# Patient Record
Sex: Female | Born: 2000 | Race: White | Hispanic: No | Marital: Single | Smoking: Never smoker
Health system: Southern US, Community
[De-identification: ages and names within clinical notes are randomized; demographics above are authoritative.]

## PROBLEM LIST (undated history)

## (undated) ENCOUNTER — Ambulatory Visit (HOSPITAL_COMMUNITY): Admission: EM | Discharge status: Absent without leave | Payer: PRIVATE HEALTH INSURANCE

## (undated) DIAGNOSIS — D229 Melanocytic nevi, unspecified: Secondary | ICD-10-CM

## (undated) DIAGNOSIS — G43909 Migraine, unspecified, not intractable, without status migrainosus: Secondary | ICD-10-CM

## (undated) DIAGNOSIS — Z8742 Personal history of other diseases of the female genital tract: Secondary | ICD-10-CM

## (undated) DIAGNOSIS — I471 Supraventricular tachycardia, unspecified: Secondary | ICD-10-CM

## (undated) DIAGNOSIS — B354 Tinea corporis: Secondary | ICD-10-CM

## (undated) DIAGNOSIS — K589 Irritable bowel syndrome without diarrhea: Secondary | ICD-10-CM

## (undated) DIAGNOSIS — S90426A Blister (nonthermal), unspecified lesser toe(s), initial encounter: Secondary | ICD-10-CM

## (undated) DIAGNOSIS — A1801 Tuberculosis of spine: Secondary | ICD-10-CM

## (undated) HISTORY — DX: Migraine, unspecified, not intractable, without status migrainosus: G43.909

## (undated) HISTORY — DX: Personal history of other diseases of the female genital tract: Z87.42

---
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
## 2020-10-21 ENCOUNTER — Other Ambulatory Visit: Payer: Self-pay | Admitting: Gastroenterology

## 2020-10-21 DIAGNOSIS — R110 Nausea: Secondary | ICD-10-CM

## 2020-10-21 DIAGNOSIS — R109 Unspecified abdominal pain: Secondary | ICD-10-CM

## 2020-11-06 ENCOUNTER — Other Ambulatory Visit: Payer: 59

## 2020-11-07 ENCOUNTER — Other Ambulatory Visit: Payer: Self-pay

## 2020-11-07 ENCOUNTER — Ambulatory Visit
Admission: RE | Admit: 2020-11-07 | Discharge: 2020-11-07 | Disposition: A | Discharge status: Home / Self Care | Payer: 59 | Source: Ambulatory Visit | Attending: Gastroenterology | Admitting: Gastroenterology

## 2020-11-07 DIAGNOSIS — R109 Unspecified abdominal pain: Secondary | ICD-10-CM

## 2020-11-07 DIAGNOSIS — R110 Nausea: Secondary | ICD-10-CM

## 2020-11-07 MED ORDER — IOPAMIDOL (ISOVUE-300) INJECTION 61%
100.0000 mL | Freq: Once | INTRAVENOUS | Status: AC | PRN
  Administered 2020-11-07: 100 mL via INTRAVENOUS

## 2022-05-23 IMAGING — CT CT ABD-PELV W/ CM
2 of 4 series · 13 of 46 positions shown, 15 images · IV contrast (iopamidol)
Comparison: None.

CLINICAL DATA: Nausea. Abdominal pain for 4 years. Acid reflux. No
history of cancer. No prior surgery.

EXAM:
CT ABDOMEN AND PELVIS WITH CONTRAST
TECHNIQUE: Multidetector CT imaging of the abdomen and pelvis was performed
using the standard protocol following bolus administration of
intravenous contrast.
CONTRAST:  100mL MSYRWY-8YY IOPAMIDOL (MSYRWY-8YY) INJECTION 61%

[Series 2: abd pelvis 5.00 br40 s3 axial · axial · 0.51mm/px · z∈[+1243,+1628]mm · 10 of 93 slices shown, 12 images]
[im 8/93  soft-tissue]
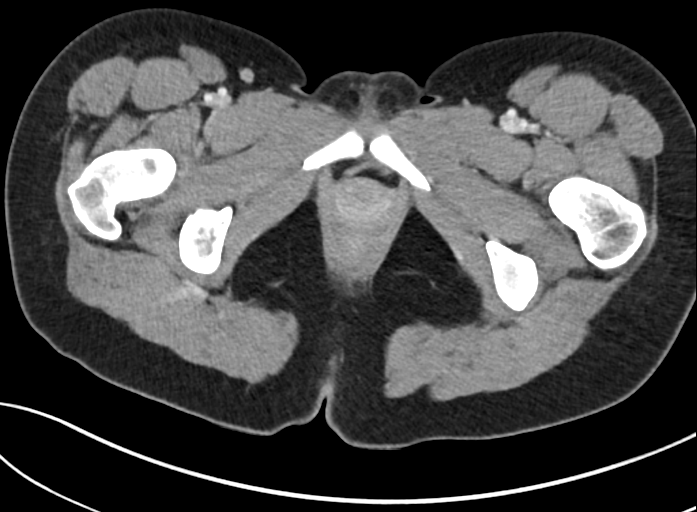
[im 8/93  bone]
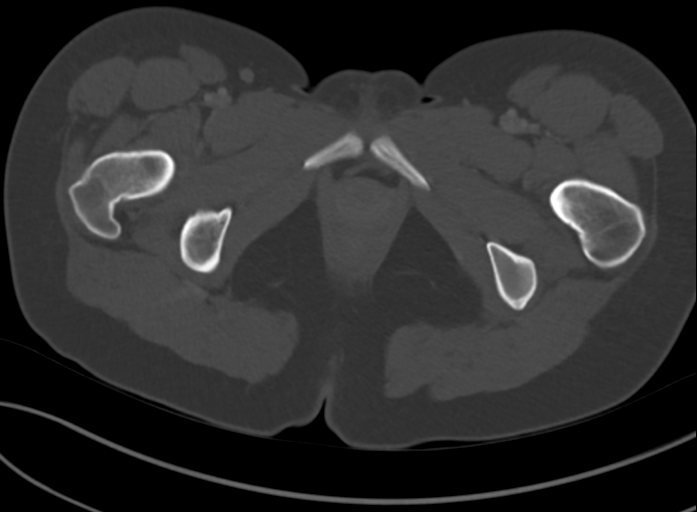
[im 16/93  soft-tissue]
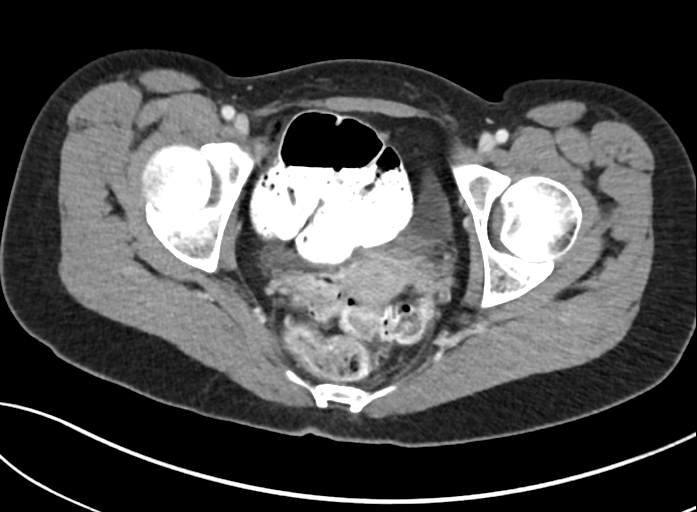
[im 24/93  soft-tissue]
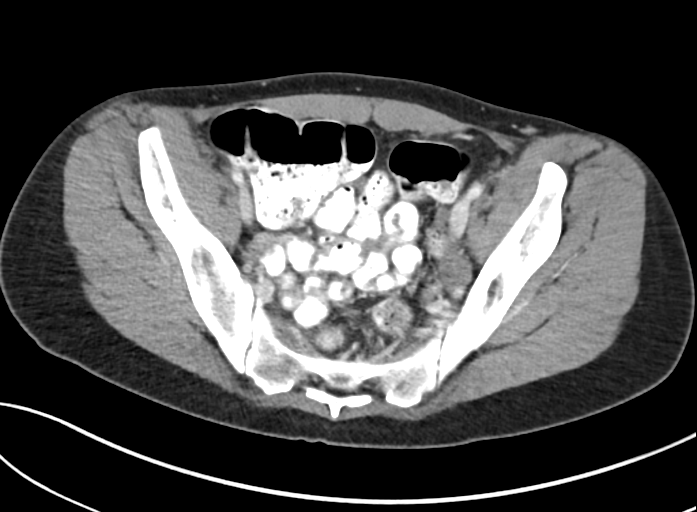
[im 35/93  soft-tissue]
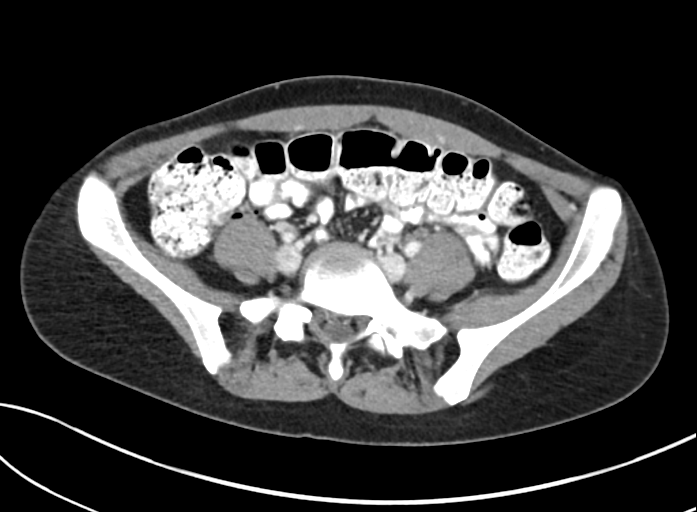
[im 43/93  soft-tissue]
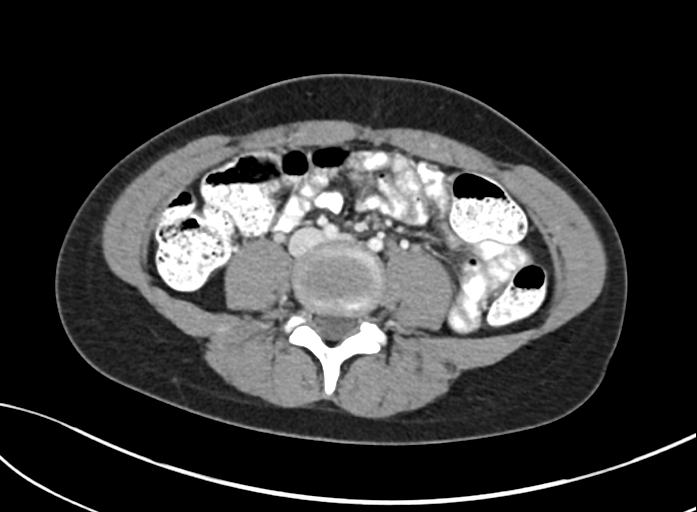
[im 50/93  soft-tissue]
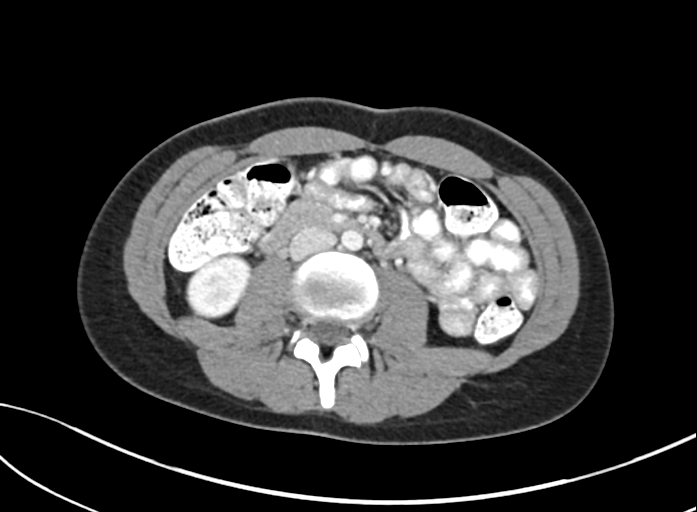
[im 58/93  soft-tissue]
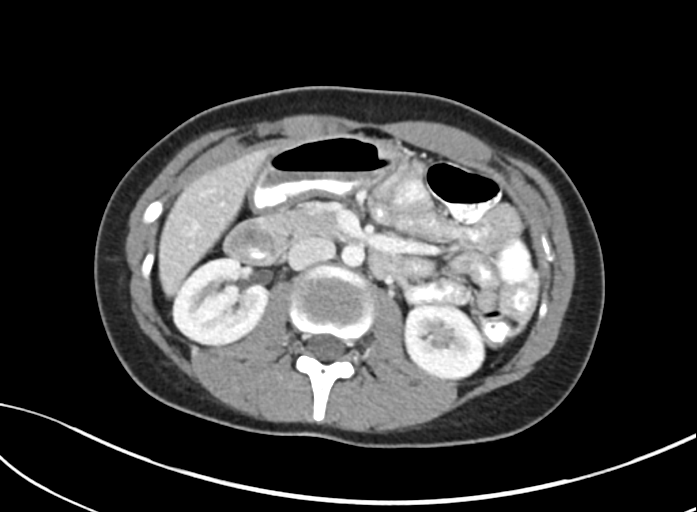
[im 70/93  soft-tissue]
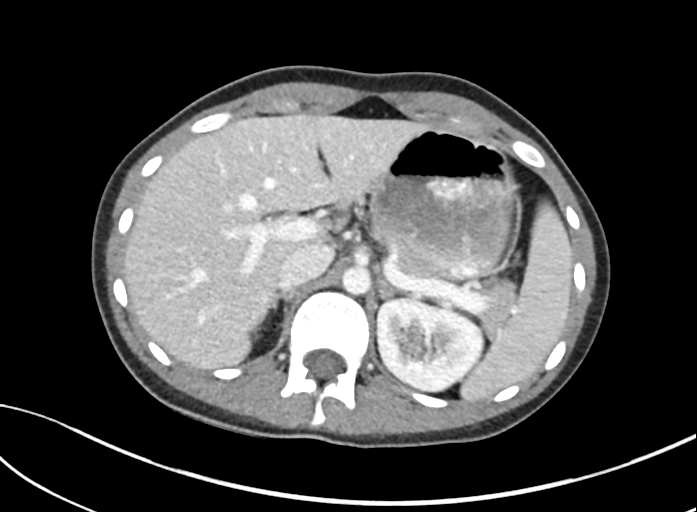
[im 77/93  soft-tissue]
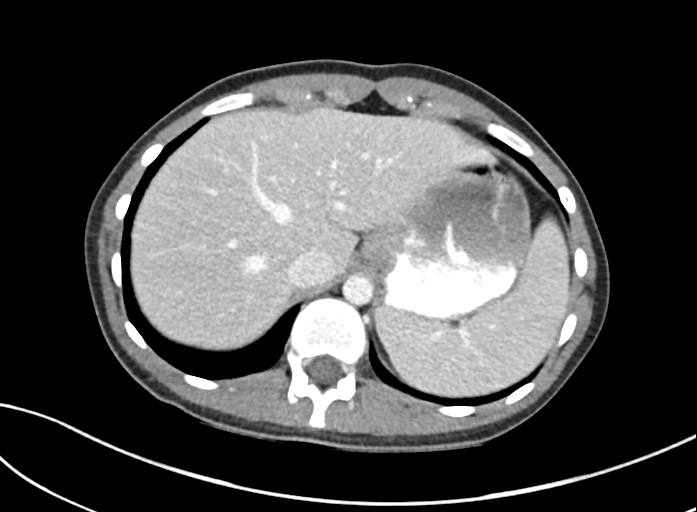
[im 77/93  bone]
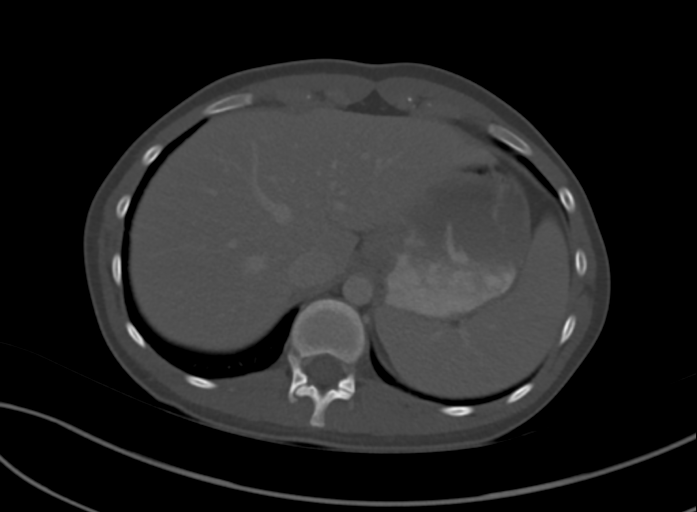
[im 85/93  soft-tissue]
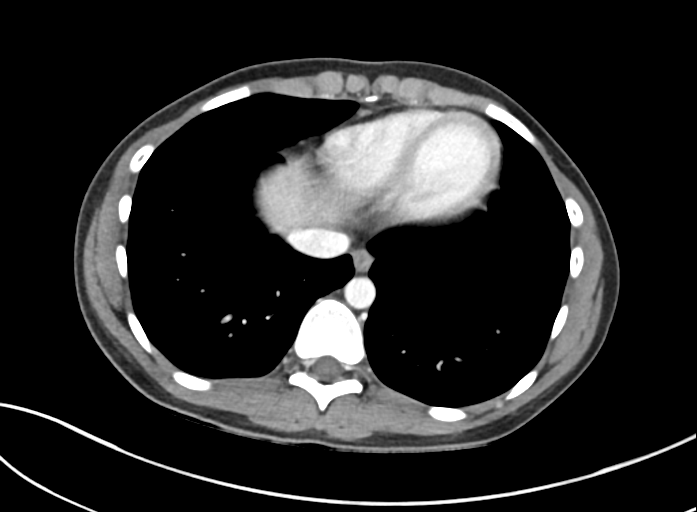

[Series 6: abd pelvis 2.00 br40 s3 cor · coronal · 0.70mm/px · 3 of 131 slices shown]
[im 44/131  soft-tissue]
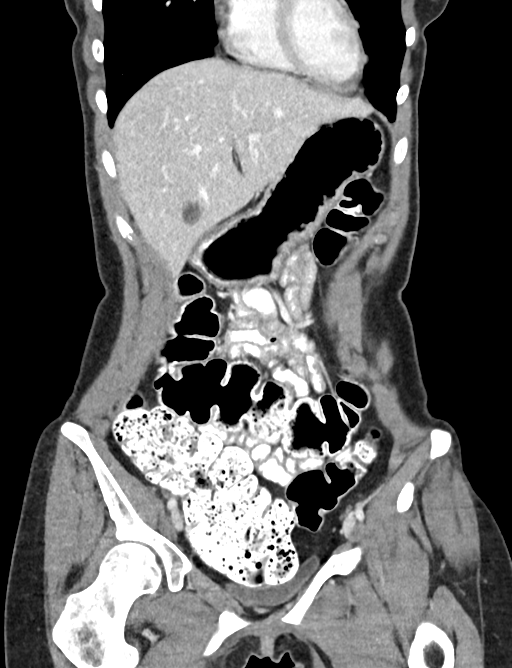
[im 58/131  soft-tissue]
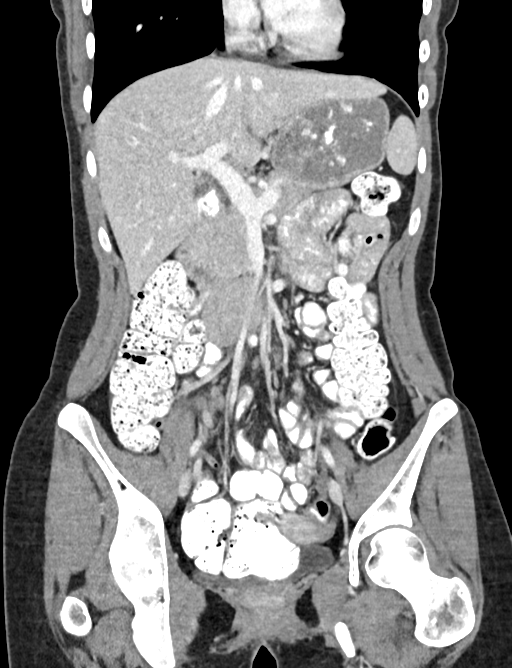
[im 73/131  soft-tissue]
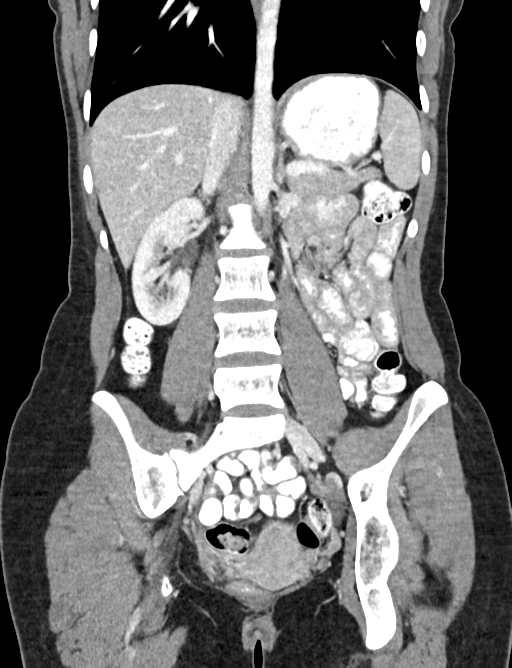

[13 of 46 positions shown; findings below may reference images not displayed]

FINDINGS: Lower chest: Clear lung bases. Normal heart size without pericardial
or pleural effusion.

Hepatobiliary: Normal liver. Normal gallbladder, without biliary
ductal dilatation.

Pancreas: Normal, without mass or ductal dilatation.

Spleen: Normal in size, without focal abnormality.

Adrenals/Urinary Tract: Normal adrenal glands. Normal kidneys,
without hydronephrosis. Normal urinary bladder.

Stomach/Bowel: Normal stomach, without wall thickening. No hiatal
hernia. The cecum extends into the upper pelvis. Normal terminal
ileum. Normal appendix, including on 55/6 coronal. Normal small
bowel.

Vascular/Lymphatic: Normal caliber of the aorta and branch vessels.
No abdominopelvic adenopathy.

Reproductive: Normal uterus and adnexa.

Other: No significant free fluid.

Musculoskeletal: No acute osseous abnormality.
IMPRESSION: Normal abdominopelvic CT.  No explanation for patient's symptoms.

## 2022-08-11 ENCOUNTER — Encounter: Payer: Self-pay | Admitting: Internal Medicine

## 2022-08-11 ENCOUNTER — Telehealth (INDEPENDENT_AMBULATORY_CARE_PROVIDER_SITE_OTHER): Payer: No Typology Code available for payment source | Admitting: Internal Medicine

## 2022-08-11 VITALS — Ht 67.0 in | Wt 125.0 lb

## 2022-08-11 DIAGNOSIS — K580 Irritable bowel syndrome with diarrhea: Secondary | ICD-10-CM

## 2022-08-11 DIAGNOSIS — R110 Nausea: Secondary | ICD-10-CM | POA: Diagnosis not present

## 2022-08-11 DIAGNOSIS — R1033 Periumbilical pain: Secondary | ICD-10-CM | POA: Diagnosis not present

## 2022-08-11 NOTE — Progress Notes (Unsigned)
Referring Provider:  Primary Care Physician:  Vanessa Lambert, No Pcp Per  Primary GI:   Vanessa Lambert Location: Home   Provider Location: [place] office   Reason for Visit: Chronic diarrhea, abdominal pain   Total time (minutes) spent on medical discussion: >21 minutes   Virtual visit was requested by Vanessa Lambert.  Virtual Visit via MyChart Video Note Due to COVID-19, visit is conducted virtually and was requested by Vanessa Lambert.   I connected with Vanessa Lambert on 08/11/22 at  4:00 PM EDT by video and verified that I am speaking with the correct person using two identifiers.   I discussed the limitations, risks, security and privacy concerns of performing an evaluation and management service by video and the availability of in person appointments. I also discussed with the Vanessa Lambert that there may be a Vanessa Lambert responsible charge related to this service. The Vanessa Lambert expressed understanding and agreed to proceed.  No chief complaint on file.    History of Present Illness: Vanessa Lambert is a pleasant 21 year old female history of migraine, dysmenorrhea, who presents to clinic today for second opinion in regards to her chronic GI issues.  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  Previously seen by [facility].  Most recently seen by Dr. Collene Mares.   Notes intermittent crampy diarrhea for years, excessive belching, abdominal bloating, nausea.  States she has to immediately have a BM after every time she eats.  Rarely has formed stool.  Notes 3-4 loose BMs a day.  Has tried cutting out foods such as dairy though symptoms do not ever completely go away.    Previous work-up:   Celiac labs were WNL.  CT abdomen pelvis with contrast 11/07/2020 unremarkable.  Takes Pepto-Bismol regularly as it is the only thing that can stop the diarrhea.  Has tried Metamucil which helps some.  Drinks ginger tea in the morning.  Trialed on dicyclomine which did not improve her symptoms.  Also  trialed on Viberzi which she states helped, cause some constipation and nausea initially but went away.  Does note developing a minor rash and small red bumps on her stomach after 1-1/2 weeks.  Now currently taking dicyclomine 20 mg 2-3 times a day.  No previous colonoscopy.    Past Medical History:  Diagnosis Date   Blister of toe 08/30/2013   Changing nevus 08/2013   right arm, abd.   History of dysmenorrhea    Migraine    Tinea corporis 08/2013     Past Surgical History:  Procedure Laterality Date   HARDWARE REMOVAL Right 10/24/2006   elbow   LESION REMOVAL Right 09/06/2013   Procedure: LESION REMOVAL RIGHT ARM AND ABDOMEN CHANGING SKIN LESION;  Surgeon: Theodoro Kos, DO;  Location: [facility];  Service: Plastics;  Laterality: Right;  and abdomen   PERCUTANEOUS PINNING ELBOW FRACTURE Right 10/01/2006   CRPP supracondylar elbow fx.     No outpatient medications have been marked as taking for the 08/11/22 encounter (Appointment) with Eloise Harman, DO.     Family History  Problem Relation Age of Onset   Heart disease Maternal Grandmother    Atrial fibrillation Maternal Grandmother    Kidney disease Maternal Grandmother        hx. nephrectomy 30s - unknown reason   Anesthesia problems Mother        post-op N/V   Anesthesia problems Father        post-op N/V   Colon cancer Neg Hx  Esophageal cancer Neg Hx     Social History   Socioeconomic History   Marital status: Single    Spouse name: Not on file   Number of children: 0   Years of education: rising college freshman   Highest education level: Not on file  Occupational History   Occupation: student    Comment: will be freshman at [employer] in fall of 2020  Tobacco Use   Smoking status: Never   Smokeless tobacco: Never  Vaping Use   Vaping Use: Never used  Substance and Sexual Activity   Alcohol use: No   Drug use: No   Sexual activity: Not on file  Other Topics Concern   Not on  file  Social History Narrative   Lives with parents.   Social Determinants of Health   Financial Resource Strain: Not on file  Food Insecurity: Not on file  Transportation Needs: Not on file  Physical Activity: Not on file  Stress: Not on file  Social Connections: Not on file       Review of Systems: Gen: Denies fever, chills, anorexia. Denies fatigue, weakness, weight loss.  CV: Denies chest pain, palpitations, syncope, peripheral edema, and claudication. Resp: Denies dyspnea at rest, cough, wheezing, coughing up blood, and pleurisy. GI: see HPI Derm: Denies rash, itching, dry skin Psych: Denies depression, anxiety, memory loss, confusion. No homicidal or suicidal ideation.  Heme: Denies bruising, bleeding, and enlarged lymph nodes.  Observations/Objective: No distress. Unable to perform physical exam due to video encounter.   Assessment and Plan:   Follow Up Instructions:    I discussed the assessment and treatment plan with the Vanessa Lambert. The Vanessa Lambert was provided an opportunity to ask questions and all were answered. The Vanessa Lambert agreed with the plan and demonstrated an understanding of the instructions.   The Vanessa Lambert was advised to call back or seek an in-person evaluation if the symptoms worsen or if the condition fails to improve as anticipated.  I provided *** minutes of face-to-face time during this MyChart Video encounter.  Annitta Needs, PhD, ANP-BC [facility]

## 2022-09-29 ENCOUNTER — Ambulatory Visit: Payer: No Typology Code available for payment source | Admitting: Internal Medicine

## 2023-02-12 NOTE — Progress Notes (Unsigned)
Referring Provider: No ref. provider found Primary Care Physician:  Patient, No Pcp Per Primary GI Physician: Dr. Marletta Lor  No chief complaint on file.   HPI:   Vanessa Lambert is a 22 y.o. female with history of migraine, dysmenorrhea, suspected IBS-D, presenting today for follow-up with chief complaint of ***  Last seen via virtual visit 08/11/2022.  She reported intermittent crampy diarrhea for years, excessive belching, abdominal bloating, nausea.  Immediate bowel movement postprandially, typically 3-4 loose bowel movements daily.  Pepto-Bismol was the only thing that would stop diarrhea.  Metamucil helped some.  She was taking dicyclomine 20 mg 2-3 times a day with minimal improvement.  Viberzi helped, but it caused a minor rash after 1-1/2 weeks.  Recommended low FODMAP diet, daily fiber, Imodium once a day to 3 times a day before meals.  Could consider trial of Xifaxan versus TCA, and may need to consider colonoscopy.  Recommended 6/8-week follow-up.  She was scheduled in December, but canceled her appointment.  Today:     Previous workup: Celiac labs within normal limits.  (Per initial consult note 08/11/2022)  CT A/P with contrast 11/07/2020 unremarkable.  No prior colonoscopy.  Past Medical History:  Diagnosis Date   Blister of toe 08/30/2013   Changing nevus 08/2013   right arm, abd.   History of dysmenorrhea    Migraine    Tinea corporis 08/2013    Past Surgical History:  Procedure Laterality Date   HARDWARE REMOVAL Right 10/24/2006   elbow   LESION REMOVAL Right 09/06/2013   Procedure: LESION REMOVAL RIGHT ARM AND ABDOMEN CHANGING SKIN LESION;  Surgeon: Wayland Denis, DO;  Location: [facility];  Service: Plastics;  Laterality: Right;  and abdomen   PERCUTANEOUS PINNING ELBOW FRACTURE Right 10/01/2006   CRPP supracondylar elbow fx.    Current Outpatient Medications  Medication Sig Dispense Refill   dicyclomine (BENTYL) 20 MG tablet Take 20 mg  by mouth 3 (three) times daily.     ibuprofen (ADVIL) 200 MG tablet Take 200 mg by mouth as needed.     naproxen sodium (ANAPROX) 550 MG tablet SMARTSIG:1 Tablet(s) By Mouth Every 12 Hours PRN     No current facility-administered medications for this visit.    Allergies as of 02/14/2023   (No Known Allergies)    Family History  Problem Relation Age of Onset   Heart disease Maternal Grandmother    Atrial fibrillation Maternal Grandmother    Kidney disease Maternal Grandmother        hx. nephrectomy 62s - unknown reason   Anesthesia problems Mother        post-op N/V   Anesthesia problems Father        post-op N/V   Colon cancer Neg Hx    Esophageal cancer Neg Hx     Social History   Socioeconomic History   Marital status: Single    Spouse name: Not on file   Number of children: 0   Years of education: rising college freshman   Highest education level: Not on file  Occupational History   Occupation: student    Comment: will be freshman at [employer] in fall of 2020  Tobacco Use   Smoking status: Never    Passive exposure: Never   Smokeless tobacco: Never  Vaping Use   Vaping Use: Never used  Substance and Sexual Activity   Alcohol use: No   Drug use: No   Sexual activity: Yes    Comment: occassional  Other Topics Concern   Not on file  Social History Narrative   Lives with parents.   Social Determinants of Health   Financial Resource Strain: Not on file  Food Insecurity: Not on file  Transportation Needs: Not on file  Physical Activity: Not on file  Stress: Not on file  Social Connections: Not on file    Review of Systems: Gen: Denies fever, chills, anorexia. Denies fatigue, weakness, weight loss.  CV: Denies chest pain, palpitations, syncope, peripheral edema, and claudication. Resp: Denies dyspnea at rest, cough, wheezing, coughing up blood, and pleurisy. GI: Denies vomiting blood, jaundice, and fecal incontinence.   Denies dysphagia or  odynophagia. Derm: Denies rash, itching, dry skin Psych: Denies depression, anxiety, memory loss, confusion. No homicidal or suicidal ideation.  Heme: Denies bruising, bleeding, and enlarged lymph nodes.  Physical Exam: There were no vitals taken for this visit. General:   Alert and oriented. No distress noted. Pleasant and cooperative.  Head:  Normocephalic and atraumatic. Eyes:  Conjuctiva clear without scleral icterus. Heart:  S1, S2 present without murmurs appreciated. Lungs:  Clear to auscultation bilaterally. No wheezes, rales, or rhonchi. No distress.  Abdomen:  +BS, soft, non-tender and non-distended. No rebound or guarding. No HSM or masses noted. Msk:  Symmetrical without gross deformities. Normal posture. Extremities:  Without edema. Neurologic:  Alert and  oriented x4 Psych:  Normal mood and affect.    Assessment:     Plan:  ***   Ermalinda Memos, PA-C [facility] 02/14/2023

## 2023-02-14 ENCOUNTER — Ambulatory Visit (INDEPENDENT_AMBULATORY_CARE_PROVIDER_SITE_OTHER): Payer: No Typology Code available for payment source | Admitting: Gastroenterology

## 2023-02-14 VITALS — BP 122/75 | HR 63 | Temp 97.6°F | Ht 67.0 in | Wt 123.6 lb

## 2023-02-14 DIAGNOSIS — K580 Irritable bowel syndrome with diarrhea: Secondary | ICD-10-CM | POA: Diagnosis not present

## 2023-02-14 MED ORDER — RIFAXIMIN 550 MG PO TABS: 550.0000 mg | ORAL_TABLET | Freq: Three times a day (TID) | ORAL | 0 refills | Status: AC

## 2023-02-14 NOTE — Patient Instructions (Signed)
Start Xifaxan 550 mg 3 times daily for 14 days. I have sent a prescription to your pharmacy.   You may continue to use Imodium as needed.  Will follow-up with you in the office in about 3 months.  Do not hesitate to call sooner if you have questions or concerns.  Ermalinda Memos, PA-C [facility]

## 2023-02-16 ENCOUNTER — Encounter: Payer: Self-pay | Admitting: Gastroenterology

## 2023-05-16 ENCOUNTER — Ambulatory Visit: Payer: No Typology Code available for payment source | Admitting: Gastroenterology

## 2023-05-30 NOTE — Progress Notes (Signed)
Referring Provider: No ref. provider found Primary Care Physician:  Patient, No Pcp Per Primary GI Physician: Dr. Marletta Lor  Chief Complaint  Patient presents with   Follow-up    Follow up. On going IBS problems. They are a little better.     HPI:   Vanessa Lambert is a 22 y.o. female  with history of migraine, dysmenorrhea, suspected IBS-D, presenting today for follow-up of diarrhea.    Last seen in our office 02/14/2023.  She reported using 1 Imodium daily and with this, was skipping 2-3 days between bowel movements.  When bowels would move, stools formed, and she would occasionally have to strain.  Without Imodium, she would have 3 mushy and occasionally watery bowel movements daily.  Occasional abdominal cramping and nausea prior to a bowel movement that improves after.  She had had an episode recently after stopping Imodium and trying peppermint oil where she passed mucous only per rectum when trying to have a bowel movement, but no recurrent symptoms.  Reported prior occasional toilet tissue hematochezia which she thought was related to a hemorrhoid that last occurrence couple months ago.  Reported she was not eating as much as she did not want to have a bowel movement and had lost a couple of pounds.  We discussed pursuing a colonoscopy to rule out IBD, but patient declined.  Planned to complete a course of Xifaxan, continue Imodium as needed, and follow-up in 3 months.  Today: No recurrent rectal bleeding or mucus per rectum.  She does feel that Xifaxan helped her symptoms.  States severity is now about a 2, down from 7.  She does continue with 2-3 loose stools a day.  Occasional abdominal cramping or nausea. Not as bad.   No weight loss. Up 5 lbs since I saw her last.   Previous workup: Celiac labs within normal limits.  (Per initial consult note 08/11/2022)   CT A/P with contrast 11/07/2020 unremarkable.   No prior colonoscopy.   Prior medications tried: Metamucil with minimal  improvement.  Dicyclomine 20 mg 2 -3 times daily with minimal improvement. Viberzi helped, but caused minor rash after 1-1/2 weeks.   Past Medical History:  Diagnosis Date   Blister of toe 08/30/2013   Changing nevus 08/2013   right arm, abd.   History of dysmenorrhea    Migraine    Tinea corporis 08/2013    Past Surgical History:  Procedure Laterality Date   HARDWARE REMOVAL Right 10/24/2006   elbow   LESION REMOVAL Right 09/06/2013   Procedure: LESION REMOVAL RIGHT ARM AND ABDOMEN CHANGING SKIN LESION;  Surgeon: Wayland Denis, DO;  Location: [facility];  Service: Plastics;  Laterality: Right;  and abdomen   PERCUTANEOUS PINNING ELBOW FRACTURE Right 10/01/2006   CRPP supracondylar elbow fx.    Current Outpatient Medications  Medication Sig Dispense Refill   ibuprofen (ADVIL) 200 MG tablet Take 200 mg by mouth as needed.     loperamide (IMODIUM A-D) 2 MG tablet Take by mouth.     naproxen sodium (ANAPROX) 550 MG tablet SMARTSIG:1 Tablet(s) By Mouth Every 12 Hours PRN     Norethin Ace-Eth Estrad-FE 1-20 MG-MCG(24) CAPS Take 1 capsule by mouth daily.     No current facility-administered medications for this visit.    Allergies as of 06/01/2023   (No Known Allergies)    Family History  Problem Relation Age of Onset   Anesthesia problems Mother        post-op N/V  Anesthesia problems Father        post-op N/V   Heart disease Maternal Grandmother    Atrial fibrillation Maternal Grandmother    Kidney disease Maternal Grandmother        hx. nephrectomy 2s - unknown reason   Colon cancer Neg Hx    Esophageal cancer Neg Hx    Inflammatory bowel disease Neg Hx     Social History   Socioeconomic History   Marital status: Single    Spouse name: Not on file   Number of children: 0   Years of education: rising college freshman   Highest education level: Not on file  Occupational History   Occupation: student    Comment: will be freshman at [employer] in  fall of 2020  Tobacco Use   Smoking status: Never    Passive exposure: Never   Smokeless tobacco: Never  Vaping Use   Vaping status: Never Used  Substance and Sexual Activity   Alcohol use: No   Drug use: No   Sexual activity: Yes    Comment: occassional  Other Topics Concern   Not on file  Social History Narrative   Lives with parents.   Social Determinants of Health   Financial Resource Strain: Not on file  Food Insecurity: Not on file  Transportation Needs: Not on file  Physical Activity: Not on file  Stress: Not on file  Social Connections: Not on file    Review of Systems: Gen: Denies fever, chills, cold or flu like symptoms, presyncope, syncope. GI: See HPI Heme: See HPI  Physical Exam: BP 105/62 (BP Location: Left Arm, Patient Position: Sitting, Cuff Size: Normal)   Pulse 76   Temp 98 F (36.7 C) (Temporal)   Ht 5\' 6"  (1.676 m)   Wt 128 lb 3.2 oz (58.2 kg)   LMP 05/17/2023 (Approximate)   SpO2 97%   BMI 20.69 kg/m  General:   Alert and oriented. No distress noted. Pleasant and cooperative.  Head:  Normocephalic and atraumatic. Eyes:  Conjuctiva clear without scleral icterus. Heart:  S1, S2 present without murmurs appreciated. Lungs:  Clear to auscultation bilaterally. No wheezes, rales, or rhonchi. No distress.  Abdomen:  +BS, soft, non-tender and non-distended. No rebound or guarding. No HSM or masses noted. Msk:  Symmetrical without gross deformities. Normal posture. Extremities:  Without edema. Neurologic:  Alert and  oriented x4 Psych:  Normal mood and affect.    Assessment:  22 y.o. female  with history of migraine, dysmenorrhea, IBS-D, presenting for follow-up of IBS following course of Xifaxan in May 2024.  She does note improvement in her overall symptoms with Xifaxan stating severity is now 2/10 from 7/10. She continues with h 2-3 loose stools a day, occasional abdominal cramping or nausea, but not as frequent.  Previously with celiac screen  within normal limits, CT in 2022 unremarkable.  She has never had a colonoscopy which was offered at her last visit, but she declined.  Had previously reported scant toilet tissue hematochezia had an episode of mucus per rectum, but has had no recurrent symptoms in several months now.  She has previously tried Metamucil and dicyclomine with minimal improvement.  Viberzi was helpful, but caused a minor rash.  We discussed continuing to manage her mild symptoms with supportive measures/diet, specifically low FODMAP diet.  We did discuss trial of amitriptyline. Patient stated she would like to think about this.    Plan:  Follow a low FODMAP diet.  Supper handout provided. Patient  will let me know if she would like to try amitriptyline. Follow-up in 3 months or sooner if needed.   Ermalinda Memos, PA-C [facility] 06/01/2023

## 2023-06-01 ENCOUNTER — Encounter: Payer: Self-pay | Admitting: Gastroenterology

## 2023-06-01 ENCOUNTER — Ambulatory Visit (INDEPENDENT_AMBULATORY_CARE_PROVIDER_SITE_OTHER): Payer: No Typology Code available for payment source | Admitting: Gastroenterology

## 2023-06-01 VITALS — BP 105/62 | HR 76 | Temp 98.0°F | Ht 66.0 in | Wt 128.2 lb

## 2023-06-01 DIAGNOSIS — K580 Irritable bowel syndrome with diarrhea: Secondary | ICD-10-CM

## 2023-06-01 NOTE — Patient Instructions (Addendum)
I recommend that you follow a low FODMAP diet.  Please see separate handout.  If you decide that you would like to try amitriptyline for your IBS symptoms, please let me know.  We will follow-up with you in 3 months or sooner if needed.  It was nice to see you again today!   Ermalinda Memos, PA-C [facility]

## 2023-06-03 ENCOUNTER — Encounter: Payer: Self-pay | Admitting: Gastroenterology

## 2023-07-29 ENCOUNTER — Encounter: Payer: Self-pay | Admitting: Internal Medicine

## 2023-12-31 ENCOUNTER — Ambulatory Visit
Admission: RE | Admit: 2023-12-31 | Discharge: 2023-12-31 | Disposition: A | Discharge status: Home / Self Care | Source: Ambulatory Visit

## 2023-12-31 VITALS — BP 107/70 | HR 74 | Temp 98.2°F | Resp 19 | Ht 67.0 in | Wt 125.0 lb

## 2023-12-31 DIAGNOSIS — J069 Acute upper respiratory infection, unspecified: Secondary | ICD-10-CM | POA: Diagnosis not present

## 2023-12-31 DIAGNOSIS — H6121 Impacted cerumen, right ear: Secondary | ICD-10-CM

## 2023-12-31 HISTORY — DX: Supraventricular tachycardia, unspecified: I47.10

## 2023-12-31 HISTORY — DX: Tuberculosis of spine: A18.01

## 2023-12-31 MED ORDER — AZITHROMYCIN 250 MG PO TABS: 250.0000 mg | ORAL_TABLET | Freq: Every day | ORAL | 0 refills | Status: DC

## 2023-12-31 MED ORDER — CARBAMIDE PEROXIDE 6.5 % OT SOLN: 5.0000 [drp] | Freq: Two times a day (BID) | OTIC | 0 refills | Status: DC

## 2023-12-31 NOTE — ED Triage Notes (Signed)
 Pt states that she has a cough, sore throat, nasal congestion, chest congestion, chills and body aches. X1 week

## 2023-12-31 NOTE — ED Triage Notes (Signed)
 Pt states that she also has a loss of voice.

## 2023-12-31 NOTE — ED Provider Notes (Signed)
 [facility]    CSN: [id number] Arrival date & time: 12/31/23  1314      History   Chief Complaint Chief Complaint  Patient presents with   Cough    Entered by patient    HPI Vanessa Lambert is a 23 y.o. female.   Patient presents to clinic over concern of cough, sore throat, nasal congestion, chest congestion, productive cough with yellow-green phlegm, chills, body aches and voice loss.  Symptoms have been present for the past week but have recently been worsening.  She is also having some right ear fullness/right ear pain.  Has not had any hot or cold chills for the past few days.  She has been taking over-the-counter medications without improvement.  Denies wheezing or shortness of breath.  She did just visit a friend in [place] who was sick with similar symptoms.  The history is provided by the patient and medical records.  Cough   Past Medical History:  Diagnosis Date   Blister of toe 08/30/2013   Changing nevus 08/2013   right arm, abd.   History of dysmenorrhea    Migraine    Pott's disease    SVT (supraventricular tachycardia) (HCC)    Tinea corporis 08/2013    Patient Active Problem List   Diagnosis Date Noted   Chronic migraine without aura without status migrainosus, not intractable 04/17/2019   Changing skin lesion 09/06/2013    Past Surgical History:  Procedure Laterality Date   HARDWARE REMOVAL Right 10/24/2006   elbow   LESION REMOVAL Right 09/06/2013   Procedure: LESION REMOVAL RIGHT ARM AND ABDOMEN CHANGING SKIN LESION;  Surgeon: Wayland Denis, DO;  Location: [facility];  Service: Plastics;  Laterality: Right;  and abdomen   PERCUTANEOUS PINNING ELBOW FRACTURE Right 10/01/2006   CRPP supracondylar elbow fx.    OB History   No obstetric history on file.      Home Medications    Prior to Admission medications   Medication Sig Start Date End Date Taking? Authorizing Provider  azithromycin (ZITHROMAX) 250 MG tablet  Take 1 tablet (250 mg total) by mouth daily. Take first 2 tablets together, then 1 every day until finished. 12/31/23  Yes Rinaldo Ratel, [place] N, FNP  carbamide peroxide (DEBROX) 6.5 % OTIC solution Place 5 drops into both ears 2 (two) times daily. 12/31/23  Yes Rinaldo Ratel, [place] N, FNP  escitalopram (LEXAPRO) 5 MG tablet Take 5 mg by mouth at bedtime.   Yes [provider]  metoprolol tartrate (LOPRESSOR) 25 MG tablet Take 12.5 mg by mouth. 12/07/23 12/06/24 Yes [provider]  ibuprofen (ADVIL) 200 MG tablet Take 200 mg by mouth as needed.    [provider]  loperamide (IMODIUM A-D) 2 MG tablet Take by mouth. 08/16/22   [provider]  naproxen sodium (ANAPROX) 550 MG tablet SMARTSIG:1 Tablet(s) By Mouth Every 12 Hours PRN 04/06/22  Yes [provider]  Norethin Ace-Eth Estrad-FE 1-20 MG-MCG(24) CAPS Take 1 capsule by mouth daily. 01/30/23  Yes [provider]  nortriptyline (PAMELOR) 10 MG capsule Take 2 capsules (20 mg total) by mouth at bedtime. 04/17/19 08/09/19  Levert Feinstein, MD  TRI-LO-MARZIA 0.18/0.215/0.25 MG-25 MCG tab Take 1 tablet by mouth daily. 03/28/19 08/09/19  [provider]    Family History Family History  Problem Relation Age of Onset   Anesthesia problems Mother        post-op N/V   Anesthesia problems Father  post-op N/V   Heart disease Maternal Grandmother    Atrial fibrillation Maternal Grandmother    Kidney disease Maternal Grandmother        hx. nephrectomy 19s - unknown reason   Colon cancer Neg Hx    Esophageal cancer Neg Hx    Inflammatory bowel disease Neg Hx     Social History Social History   Tobacco Use   Smoking status: Never    Passive exposure: Never   Smokeless tobacco: Never  Vaping Use   Vaping status: Never Used  Substance Use Topics   Alcohol use: Yes    Comment: occ   Drug use: No     Allergies   Patient has no known allergies.   Review of Systems Review of  Systems  Per HPI  Physical Exam Triage Vital Signs ED Triage Vitals  Encounter Vitals Group     BP 12/31/23 1322 107/70     Systolic BP Percentile --      Diastolic BP Percentile --      Pulse Rate 12/31/23 1322 74     Resp 12/31/23 1322 19     Temp 12/31/23 1322 98.2 F (36.8 C)     Temp Source 12/31/23 1322 Oral     SpO2 12/31/23 1322 97 %     Weight 12/31/23 1319 125 lb (56.7 kg)     Height 12/31/23 1319 5\' 7"  (1.702 m)     Head Circumference --      Peak Flow --      Pain Score 12/31/23 1319 2     Pain Loc --      Pain Education --      Exclude from Growth Chart --    No data found.  Updated Vital Signs BP 107/70 (BP Location: Right Arm)   Pulse 74   Temp 98.2 F (36.8 C) (Oral)   Resp 19   Ht 5\' 7"  (1.702 m)   Wt 125 lb (56.7 kg)   LMP 12/31/2023   SpO2 97%   BMI 19.58 kg/m   Visual Acuity Right Eye Distance:   Left Eye Distance:   Bilateral Distance:    Right Eye Near:   Left Eye Near:    Bilateral Near:     Physical Exam Vitals and nursing note reviewed.  Constitutional:      Appearance: Normal appearance.  HENT:     Head: Normocephalic and atraumatic.     Right Ear: There is impacted cerumen.     Left Ear: External ear normal.     Nose: Congestion and rhinorrhea present.     Mouth/Throat:     Mouth: Mucous membranes are moist.     Pharynx: Posterior oropharyngeal erythema present.  Eyes:     Conjunctiva/sclera: Conjunctivae normal.  Cardiovascular:     Rate and Rhythm: Normal rate and regular rhythm.     Heart sounds: Normal heart sounds. No murmur heard. Pulmonary:     Effort: Pulmonary effort is normal. No respiratory distress.     Breath sounds: Normal breath sounds. No wheezing.  Musculoskeletal:        General: Normal range of motion.  Skin:    General: Skin is warm and dry.  Neurological:     General: No focal deficit present.     Mental Status: She is alert.  Psychiatric:        Mood and Affect: Mood normal.      UC  Treatments / Results  Labs (all labs ordered are  listed, but only abnormal results are displayed) Labs Reviewed - No data to display  EKG   Radiology No results found.  Procedures Procedures (including critical care time)  Medications Ordered in UC Medications - No data to display  Initial Impression / Assessment and Plan / UC Course  I have reviewed the triage vital signs and the nursing notes.  Pertinent labs & imaging results that were available during my care of the patient were reviewed by me and considered in my medical decision making (see chart for details).  Vitals and triage reviewed, patient is hemodynamically stable.  Lungs are vesicular, heart with regular rate and rhythm.  Congestion, rhinorrhea and postnasal drip present on physical exam.  Right ear pain and fullness reveals cerumen impaction.  Staff attempted irrigation, I attempted manual removal and irrigation again with minor success.  Cerumen impaction remains.  Encouraged wax softening drops and have patient return to clinic or follow-up with primary care provider for irrigation within the next week.  Plan of care, follow-up care return precautions given, no questions at this time.     Final Clinical Impressions(s) / UC Diagnoses   Final diagnoses:  Acute upper respiratory infection  Impacted cerumen of right ear     Discharge Instructions      Take the azithromycin as prescribed.  Take this with food to help prevent gastrointestinal upset.  1200 mg of Mucinex daily can help break up secretions.  Warm saline gargles, tea with honey and voice rest can help with your sore throat and voice loss.  We attempted to irrigate out your right ear, or you had wax buildup.  We got some of the wax out but some still remains.  Use the wax softening drops.  You can buy an over-the-counter wax removal device or return to clinic in the next week or so or your primary care to attempt further removal of the wax.      ED  Prescriptions     Medication Sig Dispense Auth. Provider   azithromycin (ZITHROMAX) 250 MG tablet Take 1 tablet (250 mg total) by mouth daily. Take first 2 tablets together, then 1 every day until finished. 6 tablet Rinaldo Ratel, [place] N, FNP   carbamide peroxide (DEBROX) 6.5 % OTIC solution Place 5 drops into both ears 2 (two) times daily. 15 mL Robertson Colclough, [place] N, FNP      PDMP not reviewed this encounter.   Cartier Mapel, [place] N, [place] 12/31/23 1349

## 2023-12-31 NOTE — Discharge Instructions (Addendum)
 Take the azithromycin as prescribed.  Take this with food to help prevent gastrointestinal upset.  1200 mg of Mucinex daily can help break up secretions.  Warm saline gargles, tea with honey and voice rest can help with your sore throat and voice loss.  We attempted to irrigate out your right ear, or you had wax buildup.  We got some of the wax out but some still remains.  Use the wax softening drops.  You can buy an over-the-counter wax removal device or return to clinic in the next week or so or your primary care to attempt further removal of the wax.

## 2024-03-11 ENCOUNTER — Ambulatory Visit (HOSPITAL_COMMUNITY)
Admission: EM | Admit: 2024-03-11 | Discharge: 2024-03-11 | Disposition: A | Discharge status: Home / Self Care | Attending: Nurse Practitioner | Admitting: Nurse Practitioner

## 2024-03-11 ENCOUNTER — Encounter (HOSPITAL_COMMUNITY): Payer: Self-pay

## 2024-03-11 DIAGNOSIS — R5383 Other fatigue: Secondary | ICD-10-CM | POA: Diagnosis present

## 2024-03-11 DIAGNOSIS — Z3202 Encounter for pregnancy test, result negative: Secondary | ICD-10-CM | POA: Insufficient documentation

## 2024-03-11 DIAGNOSIS — R509 Fever, unspecified: Secondary | ICD-10-CM | POA: Diagnosis present

## 2024-03-11 DIAGNOSIS — K580 Irritable bowel syndrome with diarrhea: Secondary | ICD-10-CM | POA: Diagnosis not present

## 2024-03-11 HISTORY — DX: Irritable bowel syndrome, unspecified: K58.9

## 2024-03-11 LAB — CBC WITH DIFFERENTIAL/PLATELET
Abs Immature Granulocytes: 0.01 10*3/uL (ref 0.00–0.07)
Basophils Absolute: 0 10*3/uL (ref 0.0–0.1)
Basophils Relative: 1 %
Eosinophils Absolute: 0.1 10*3/uL (ref 0.0–0.5)
Eosinophils Relative: 2 %
HCT: 39.5 % (ref 36.0–46.0)
Hemoglobin: 13.5 g/dL (ref 12.0–15.0)
Immature Granulocytes: 0 %
Lymphocytes Relative: 48 %
Lymphs Abs: 2.6 10*3/uL (ref 0.7–4.0)
MCH: 30.5 pg (ref 26.0–34.0)
MCHC: 34.2 g/dL (ref 30.0–36.0)
MCV: 89.4 fL (ref 80.0–100.0)
Monocytes Absolute: 0.5 10*3/uL (ref 0.1–1.0)
Monocytes Relative: 9 %
Neutro Abs: 2.2 10*3/uL (ref 1.7–7.7)
Neutrophils Relative %: 40 %
Platelets: 318 10*3/uL (ref 150–400)
RBC: 4.42 MIL/uL (ref 3.87–5.11)
RDW: 11.7 % (ref 11.5–15.5)
WBC: 5.4 10*3/uL (ref 4.0–10.5)
nRBC: 0 % (ref 0.0–0.2)

## 2024-03-11 LAB — POC COVID19/FLU A&B COMBO
Covid Antigen, POC: NEGATIVE
Influenza A Antigen, POC: NEGATIVE
Influenza B Antigen, POC: NEGATIVE

## 2024-03-11 LAB — POCT URINALYSIS DIP (MANUAL ENTRY)
Bilirubin, UA: NEGATIVE
Blood, UA: NEGATIVE
Glucose, UA: NEGATIVE mg/dL
Ketones, POC UA: NEGATIVE mg/dL
Leukocytes, UA: NEGATIVE
Nitrite, UA: NEGATIVE
Protein Ur, POC: NEGATIVE mg/dL
Spec Grav, UA: 1.02 (ref 1.010–1.025)
Urobilinogen, UA: 0.2 U/dL
pH, UA: 7 (ref 5.0–8.0)

## 2024-03-11 LAB — COMPREHENSIVE METABOLIC PANEL WITH GFR
ALT: 15 U/L (ref 0–44)
AST: 25 U/L (ref 15–41)
Albumin: 4.4 g/dL (ref 3.5–5.0)
Alkaline Phosphatase: 58 U/L (ref 38–126)
Anion gap: 11 (ref 5–15)
BUN: 13 mg/dL (ref 6–20)
CO2: 25 mmol/L (ref 22–32)
Calcium: 9.9 mg/dL (ref 8.9–10.3)
Chloride: 101 mmol/L (ref 98–111)
Creatinine, Ser: 0.63 mg/dL (ref 0.44–1.00)
GFR, Estimated: >60 mL/min (ref >60)
Glucose, Bld: 75 mg/dL (ref 70–99)
Potassium: 3.9 mmol/L (ref 3.5–5.1)
Sodium: 137 mmol/L (ref 135–145)
Total Bilirubin: 0.7 mg/dL (ref 0.0–1.2)
Total Protein: 7 g/dL (ref 6.5–8.1)

## 2024-03-11 LAB — TSH: TSH: 1.59 u[IU]/mL (ref 0.350–4.500)

## 2024-03-11 LAB — POCT URINE PREGNANCY: Preg Test, Ur: NEGATIVE

## 2024-03-11 LAB — POCT FASTING CBG KUC MANUAL ENTRY: POCT Glucose (KUC): 83 mg/dL (ref 70–99)

## 2024-03-11 NOTE — Discharge Instructions (Addendum)
 Urine sample today looks great and blood sugar is stable.  COVID-19 and influenza testing is also negative. We have checked blood work and will contact you with abnormal results from the blood work.  In the meantime, continue to hydrate with plenty of fluids and make sure you are eating meals high in protein.  Seek care in ER if symptoms worsen.

## 2024-03-11 NOTE — ED Provider Notes (Signed)
 [facility]    CSN: [id number] Arrival date & time: 03/11/24  1527      History   Chief Complaint Chief Complaint  Patient presents with   Fever   Fatigue    HPI Vanessa Lambert is a 23 y.o. female.   Patient presents today with mother for 1 day history of fever approximately 24 hours ago, Tmax 100.3 F, slight headache, slight abdominal pain that she describes as typical IBS pain and diarrhea, shaking, palpitations associated with the fever, and nausea.  Reports she felt "out of it" when she was having the symptoms.  She ate dinner last night and the symptoms improved.  She felt well until earlier today when symptoms recurred.  No cough, shortness of breath or chest pain, runny or stuffy nose, sore throat, ear pain, vomiting, change in appetite.  No urinary symptoms including no increased urinary frequency or urgency, voiding smaller amounts, change in appetite.  Reports Lexapro was increased about 1 month ago.  Reports she has just been feeling "out of it" today.    Past Medical History:  Diagnosis Date   Blister of toe 08/30/2013   Changing nevus 08/2013   right arm, abd.   History of dysmenorrhea    IBS (irritable bowel syndrome)    Migraine    Pott's disease    SVT (supraventricular tachycardia) (HCC)    Tinea corporis 08/2013    Patient Active Problem List   Diagnosis Date Noted   Chronic migraine without aura without status migrainosus, not intractable 04/17/2019   Changing skin lesion 09/06/2013    Past Surgical History:  Procedure Laterality Date   HARDWARE REMOVAL Right 10/24/2006   elbow   LESION REMOVAL Right 09/06/2013   Procedure: LESION REMOVAL RIGHT ARM AND ABDOMEN CHANGING SKIN LESION;  Surgeon: Marilou Showman, DO;  Location: [facility];  Service: Plastics;  Laterality: Right;  and abdomen   PERCUTANEOUS PINNING ELBOW FRACTURE Right 10/01/2006   [facility] supracondylar elbow fx.    OB History   No obstetric history on file.       Home Medications    Prior to Admission medications   Medication Sig Start Date End Date Taking? Authorizing Provider  escitalopram (LEXAPRO) 10 MG tablet Take 10 mg by mouth at bedtime. 03/06/24  Yes [provider]  ibuprofen  (ADVIL ) 200 MG tablet Take 200 mg by mouth as needed.   Yes [provider]  loperamide (IMODIUM A-D) 2 MG tablet Take by mouth. 08/16/22  Yes [provider]  metoprolol tartrate (LOPRESSOR) 25 MG tablet Take 12.5 mg by mouth. 12/07/23 12/06/24 Yes [provider]  naproxen sodium (ANAPROX) 550 MG tablet SMARTSIG:1 Tablet(s) By Mouth Every 12 Hours PRN 04/06/22  Yes [provider]  nortriptyline  (PAMELOR ) 10 MG capsule Take 2 capsules (20 mg total) by mouth at bedtime. 04/17/19 08/09/19  Phebe Brasil, MD  TRI-LO-MARZIA 0.18/0.215/0.25 MG-25 MCG tab Take 1 tablet by mouth daily. 03/28/19 08/09/19  [provider]    Family History Family History  Problem Relation Age of Onset   Anesthesia problems Mother        post-op N/V   Anesthesia problems Father        post-op N/V   Heart disease Maternal Grandmother    Atrial fibrillation Maternal Grandmother    Kidney disease Maternal Grandmother        hx. nephrectomy 47s - unknown reason   Colon cancer Neg Hx    Esophageal cancer Neg Hx  Inflammatory bowel disease Neg Hx     Social History Social History   Tobacco Use   Smoking status: Never    Passive exposure: Never   Smokeless tobacco: Never  Vaping Use   Vaping status: Never Used  Substance Use Topics   Alcohol use: Yes    Comment: occ   Drug use: No     Allergies   Patient has no known allergies.   Review of Systems Review of Systems Per HPI  Physical Exam Triage Vital Signs ED Triage Vitals  Encounter Vitals Group     BP 03/11/24 1616 105/70     Systolic BP Percentile --      Diastolic BP Percentile --      Pulse Rate 03/11/24 1616 69     Resp 03/11/24 1616 18     Temp  03/11/24 1616 98.5 F (36.9 C)     Temp Source 03/11/24 1616 Oral     SpO2 03/11/24 1616 98 %     Weight --      Height --      Head Circumference --      Peak Flow --      Pain Score 03/11/24 1614 1     Pain Loc --      Pain Education --      Exclude from Growth Chart --    No data found.  Updated Vital Signs BP 105/70 (BP Location: Right Arm)   Pulse 69   Temp 98.5 F (36.9 C) (Oral)   Resp 18   LMP 03/05/2024 (Approximate)   SpO2 98%   Visual Acuity Right Eye Distance:   Left Eye Distance:   Bilateral Distance:    Right Eye Near:   Left Eye Near:    Bilateral Near:     Physical Exam Vitals and nursing note reviewed.  Constitutional:      General: She is not in acute distress.    Appearance: Normal appearance. She is not ill-appearing or toxic-appearing.  HENT:     Head: Normocephalic and atraumatic.     Right Ear: Tympanic membrane, ear canal and external ear normal.     Left Ear: Tympanic membrane, ear canal and external ear normal.     Nose: No congestion or rhinorrhea.     Mouth/Throat:     Mouth: Mucous membranes are moist.     Pharynx: Oropharynx is clear. No oropharyngeal exudate or posterior oropharyngeal erythema.  Eyes:     General: No scleral icterus.    Extraocular Movements: Extraocular movements intact.  Cardiovascular:     Rate and Rhythm: Normal rate and regular rhythm.  Pulmonary:     Effort: Pulmonary effort is normal. No respiratory distress.     Breath sounds: Normal breath sounds. No wheezing, rhonchi or rales.  Abdominal:     General: Abdomen is flat. Bowel sounds are normal. There is no distension.     Palpations: Abdomen is soft.     Tenderness: There is no abdominal tenderness. There is no right CVA tenderness, left CVA tenderness or guarding.  Musculoskeletal:     Cervical back: Normal range of motion and neck supple.  Lymphadenopathy:     Cervical: No cervical adenopathy.  Skin:    General: Skin is warm and dry.      Coloration: Skin is not jaundiced or pale.     Findings: No erythema or rash.  Neurological:     General: No focal deficit present.     Mental Status:  She is alert and oriented to person, place, and time.  Psychiatric:        Behavior: Behavior is cooperative.      UC Treatments / Results  Labs (all labs ordered are listed, but only abnormal results are displayed) Labs Reviewed  POC COVID19/FLU A&B COMBO - Normal  COMPREHENSIVE METABOLIC PANEL WITH GFR  CBC WITH DIFFERENTIAL/PLATELET  TSH  POCT FASTING CBG [facility] MANUAL ENTRY  POCT URINE PREGNANCY  POCT URINALYSIS DIP (MANUAL ENTRY)    EKG   Radiology No results found.  Procedures Procedures (including critical care time)  Medications Ordered in UC Medications - No data to display  Initial Impression / Assessment and Plan / UC Course  I have reviewed the triage vital signs and the nursing notes.  Pertinent labs & imaging results that were available during my care of the patient were reviewed by me and considered in my medical decision making (see chart for details).   Patient is well-appearing, normotensive, afebrile, not tachycardic, not tachypneic, oxygenating well on room air.   1. Other fatigue 2. Urine pregnancy test negative Urinalysis is unremarkable; CBG is not low; COVID-19 and influenza testing is negative Unclear etiology TSH obtained as well as CMP and CBC to rule out metabolic or infectious cause of symptoms In meantime, recommended hydration plenty of fluids, high-protein foods to help stabilize blood sugar Return and ER precautions discussed with patient  The patient was given the opportunity to ask questions.  All questions answered to their satisfaction.  The patient is in agreement to this plan.   Final Clinical Impressions(s) / UC Diagnoses   Final diagnoses:  Other fatigue  Urine pregnancy test negative     Discharge Instructions      Urine sample today looks great and blood sugar is  stable.  COVID-19 and influenza testing is also negative. We have checked blood work and will contact you with abnormal results from the blood work.  In the meantime, continue to hydrate with plenty of fluids and make sure you are eating meals high in protein.  Seek care in ER if symptoms worsen.   ED Prescriptions   None    PDMP not reviewed this encounter.   Wilhemena Harbour, NP 03/11/24 [phone]

## 2024-03-11 NOTE — ED Triage Notes (Signed)
 Chief Complaint: fever of 100.3, tachypnea, felt "out of it, slight abdominal pain (chronic), shaking, felt like heart was racing. Denies any other sick symptoms. Had nausea last night and diarrhea today but Patient has history of IBS.   Sick exposure: Yes- Patient does wok in a doctor's office but no known direct contact   Onset: last night  Prescriptions or OTC medications tried: No    New foods, medications, or products: No  Recent Travel: No

## 2024-03-12 ENCOUNTER — Ambulatory Visit (HOSPITAL_COMMUNITY): Payer: Self-pay
